# Patient Record
Sex: Male | Born: 1995 | Race: Black or African American | Hispanic: No | Marital: Single | Smoking: Never smoker
Health system: Southern US, Community
[De-identification: ages and names within clinical notes are randomized; demographics above are authoritative.]

## PROBLEM LIST (undated history)

## (undated) DIAGNOSIS — J45909 Unspecified asthma, uncomplicated: Secondary | ICD-10-CM

---
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
## 2016-07-14 ENCOUNTER — Encounter (HOSPITAL_COMMUNITY): Payer: Self-pay | Admitting: Family Medicine

## 2016-07-14 ENCOUNTER — Ambulatory Visit (HOSPITAL_COMMUNITY)
Admission: EM | Admit: 2016-07-14 | Discharge: 2016-07-14 | Disposition: A | Discharge status: Home / Self Care | Payer: Medicaid Other | Attending: Family Medicine | Admitting: Family Medicine

## 2016-07-14 DIAGNOSIS — L0231 Cutaneous abscess of buttock: Secondary | ICD-10-CM | POA: Diagnosis not present

## 2016-07-14 MED ORDER — HYDROCODONE-ACETAMINOPHEN 5-325 MG PO TABS: 1.0000 | ORAL_TABLET | ORAL | 0 refills | Status: DC | PRN

## 2016-07-14 NOTE — ED Triage Notes (Signed)
Pt here for abscess inside of right buttocks.

## 2016-07-14 NOTE — Discharge Instructions (Signed)
Apply warm compresses 3-4 times a day. Be sure to keep the area clean and dried possible. Return in 2 days for wound check and to have the packing removed. The area will be sore and tender. Take ibuprofen 600 mg every 6 hours as needed for pain.

## 2016-07-14 NOTE — ED Provider Notes (Signed)
CSN: [id number]     Arrival date & time 07/14/16  1414 History   First MD Initiated Contact with Patient 07/14/16 1443     Chief Complaint  Patient presents with  . Abscess   (Consider location/radiation/quality/duration/timing/severity/associated sxs/prior Treatment) 21 year old male states he felt a small bump developing on his right buttocks probably 2 weeks ago. Has been getting larger and more painful. He states it is painful to sit down or lie on the right side due to the growing abscess.      Past Medical History:  Diagnosis Date  . Asthma    History reviewed. No pertinent surgical history. History reviewed. No pertinent family history. Social History  Substance Use Topics  . Smoking status: Never Smoker  . Smokeless tobacco: Never Used  . Alcohol use Yes    Review of Systems  Constitutional: Negative.   Musculoskeletal: Negative.   Skin:       Abscess formation to the right medial buttock as per history of present illness.  Neurological: Negative.   All other systems reviewed and are negative.   Allergies  Patient has no known allergies.  Home Medications   Prior to Admission medications   Medication Sig Start Date End Date Taking? Authorizing Provider  famotidine (PEPCID) 20 MG tablet Take 2 tablets (40 mg total) by mouth 2 (two) times daily. 04/21/13   Renne CriglerJoshua Geiple, PA-C  HYDROcodone-acetaminophen (NORCO/VICODIN) 5-325 MG tablet Take 1 tablet by mouth every 4 (four) hours as needed. 07/14/16   Hayden Rasmussenavid Cannie Muckle, NP  ranitidine (ZANTAC) 150 MG tablet Take 150 mg by mouth once.    Historical Provider, MD   Meds Ordered and Administered this Visit  Medications - No data to display  BP 106/68   Pulse 61   Temp 98.3 F (36.8 C)   Resp 18   SpO2 99%  No data found.   Physical Exam  Constitutional: He is oriented to person, place, and time. He appears well-developed and well-nourished. No distress.  Pulmonary/Chest: Effort normal.  Abdominal: Soft.   Musculoskeletal: He exhibits no edema.  Neurological: He is alert and oriented to person, place, and time.  Skin: Skin is warm and dry.  There is an abscess approximately 4 cm x 3 cm along the medial aspect of the right buttock and gluteal cleft. It does not involve the anus. Currently it is not draining. No surrounding cellulitis.  Psychiatric: He has a normal mood and affect.  Nursing note and vitals reviewed.   Urgent Care Course     Procedures (including critical care time)  Labs Review Labs Reviewed - No data to display  Imaging Review No results found.   Visual Acuity Review  Right Eye Distance:   Left Eye Distance:   Bilateral Distance:    Right Eye Near:   Left Eye Near:    Bilateral Near:         MDM   1. Abscess of buttock, right    Apply warm compresses 3-4 times a day. Be sure to keep the area clean and dried possible. Return in 2 days for wound check and to have the packing removed. The area will be sore and tender. Take ibuprofen 600 mg every 6 hours as needed for pain. Meds ordered this encounter  Medications  . HYDROcodone-acetaminophen (NORCO/VICODIN) 5-325 MG tablet    Sig: Take 1 tablet by mouth every 4 (four) hours as needed.    Dispense:  10 tablet    Refill:  0  Order Specific Question:   Supervising Provider    Answer:   Elvina Sidle [[id number]]   Dressing applied and assisted by  Kipp Brood, EMT    Hayden Rasmussen, NP 07/14/16 1537    Hayden Rasmussen, NP 07/14/16 1539

## 2016-07-17 ENCOUNTER — Encounter (HOSPITAL_COMMUNITY): Payer: Self-pay | Admitting: *Deleted

## 2016-07-17 ENCOUNTER — Ambulatory Visit (HOSPITAL_COMMUNITY)
Admission: EM | Admit: 2016-07-17 | Discharge: 2016-07-17 | Disposition: A | Discharge status: Home / Self Care | Payer: Medicaid Other | Attending: Radiology | Admitting: Radiology

## 2016-07-17 DIAGNOSIS — Z5189 Encounter for other specified aftercare: Secondary | ICD-10-CM

## 2016-07-17 MED ORDER — CEPHALEXIN 500 MG PO CAPS: 500.0000 mg | ORAL_CAPSULE | Freq: Three times a day (TID) | ORAL | 0 refills | Status: DC

## 2016-07-17 NOTE — ED Triage Notes (Signed)
Presents for f/u buttock abscess.  Packing remains in place per pt.  States feeling much better.

## 2016-07-17 NOTE — ED Provider Notes (Signed)
CSN: [id number]     Arrival date & time 07/17/16  1245 History   None    Chief Complaint  Patient presents with  . Wound Check   (Consider location/radiation/quality/duration/timing/severity/associated sxs/prior Treatment) 21 y.o. male presents with wound recheck. Patient was seen at this faicilty on 2.7.18 for an abscess to the right medial buttocks/  Condition is acute in nature. Condition is made better by packing and antibiotics Condition is made worse by nothing. Patient denies any fevers and reports that the abscess has improved. Packing was removed in 1 piece within minimal bleeding and no pus noted.        Past Medical History:  Diagnosis Date  . Asthma    History reviewed. No pertinent surgical history. No family history on file. Social History  Substance Use Topics  . Smoking status: Never Smoker  . Smokeless tobacco: Never Used  . Alcohol use Yes    Review of Systems  Constitutional: Negative for chills and fever.  HENT: Negative for ear pain and sore throat.   Eyes: Negative for pain and visual disturbance.  Respiratory: Negative for cough and shortness of breath.   Cardiovascular: Negative for chest pain and palpitations.  Gastrointestinal: Negative for abdominal pain and vomiting.  Genitourinary: Negative for dysuria and hematuria.  Musculoskeletal: Negative for arthralgias and back pain.  Skin: Negative for color change and rash.       Resolving abscess to right buttocks  Neurological: Negative for seizures and syncope.  All other systems reviewed and are negative.   Allergies  Patient has no known allergies.  Home Medications   Prior to Admission medications   Medication Sig Start Date End Date Taking? Authorizing Provider  famotidine (PEPCID) 20 MG tablet Take 2 tablets (40 mg total) by mouth 2 (two) times daily. 04/21/13   Renne CriglerJoshua Geiple, PA-C  HYDROcodone-acetaminophen (NORCO/VICODIN) 5-325 MG tablet Take 1 tablet by mouth every 4 (four) hours as  needed. 07/14/16   Hayden Rasmussenavid Mabe, NP  ranitidine (ZANTAC) 150 MG tablet Take 150 mg by mouth once.    Historical Provider, MD   Meds Ordered and Administered this Visit  Medications - No data to display  BP 118/66   Pulse 68   Temp 98.3 F (36.8 C) (Oral)   Resp 16   SpO2 100%  No data found.   Physical Exam  Constitutional: He appears well-developed and well-nourished.  HENT:  Head: Normocephalic and atraumatic.  Eyes: Conjunctivae are normal.  Neck: Neck supple.  Cardiovascular: Normal rate and regular rhythm.   No murmur heard. Pulmonary/Chest: Effort normal and breath sounds normal. No respiratory distress.  Abdominal: Soft. There is no tenderness.  Musculoskeletal: He exhibits no edema.  Neurological: He is alert.  Skin: Skin is warm and dry.  Resolving abscess to right medial buttocks.   Psychiatric: He has a normal mood and affect.  Nursing note and vitals reviewed.   Urgent Care Course     Procedures (including critical care time)  Labs Review Labs Reviewed - No data to display  Imaging Review No results found.      MDM   1. Visit for wound check        Alene MiresJennifer C Omohundro, NP 07/17/16 1535

## 2016-07-17 NOTE — Discharge Instructions (Signed)
Continue antibiotics

## 2018-05-25 ENCOUNTER — Other Ambulatory Visit: Payer: Self-pay

## 2018-05-25 ENCOUNTER — Encounter (HOSPITAL_COMMUNITY): Payer: Self-pay

## 2018-05-25 ENCOUNTER — Ambulatory Visit (HOSPITAL_COMMUNITY)
Admission: EM | Admit: 2018-05-25 | Discharge: 2018-05-25 | Disposition: A | Discharge status: Home / Self Care | Payer: Self-pay | Attending: Family Medicine | Admitting: Family Medicine

## 2018-05-25 DIAGNOSIS — G43909 Migraine, unspecified, not intractable, without status migrainosus: Secondary | ICD-10-CM | POA: Insufficient documentation

## 2018-05-25 DIAGNOSIS — J069 Acute upper respiratory infection, unspecified: Secondary | ICD-10-CM | POA: Insufficient documentation

## 2018-05-25 MED ORDER — BUTALBITAL-APAP-CAFFEINE 50-325-40 MG PO TABS: 1.0000 | ORAL_TABLET | Freq: Four times a day (QID) | ORAL | 0 refills | Status: AC | PRN

## 2018-05-25 NOTE — ED Provider Notes (Signed)
[facility]    CSN: [id number] Arrival date & time: 05/25/18  1026     History   Chief Complaint Chief Complaint  Patient presents with  . Cough  . Migraine    HPI Rodney Becker is a 22 y.o. male.   HPI  Patient is here with 2 complaints.  First he has a runny and stuffy nose, mild cough, low-grade fever.  This is been going on for about a week.  He has some right ear discomfort.  Mild sore throat.  No body aches, no fatigue.  He has continued with his normal activities.  Today he woke up with a headache.  He has some light sensitivity.  He does have a history of migraines.  Today he feels like he has a migraine on top of his "head cold".  No exposure to influenza.  No exposure to strep.  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  No nausea or vomiting.  Past Medical History:  Diagnosis Date  . Asthma     There are no active problems to display for this patient.   History reviewed. No pertinent surgical history.     Home Medications    Prior to Admission medications   Medication Sig Start Date End Date Taking? Authorizing Provider  butalbital-acetaminophen-caffeine (FIORICET, [facility]) [id number] MG tablet Take 1-2 tablets by mouth every 6 (six) hours as needed for headache. 05/25/18 05/25/19  Eustace MooreNelson, Qais Jowers Sue, MD    Family History History reviewed. No pertinent family history.  Social History Social History   Tobacco Use  . Smoking status: Never Smoker  . Smokeless tobacco: Never Used  Substance Use Topics  . Alcohol use: Yes  . Drug use: No     Allergies   Patient has no known allergies.   Review of Systems Review of Systems  Constitutional: Negative for chills and fever.  HENT: Positive for congestion, postnasal drip, rhinorrhea and sore throat. Negative for ear pain.   Eyes: Positive for photophobia. Negative for pain and visual disturbance.  Respiratory: Positive for cough. Negative for shortness of breath.     Cardiovascular: Negative for chest pain and palpitations.  Gastrointestinal: Negative for abdominal pain and vomiting.  Genitourinary: Negative for dysuria and hematuria.  Musculoskeletal: Negative for arthralgias and back pain.  Skin: Negative for color change and rash.  Neurological: Positive for headaches. Negative for seizures and syncope.  All other systems reviewed and are negative.    Physical Exam Triage Vital Signs ED Triage Vitals  Enc Vitals Group     BP 05/25/18 1145 115/73     Pulse Rate 05/25/18 1145 81     Resp 05/25/18 1145 16     Temp 05/25/18 1145 99.2 F (37.3 C)     Temp Source 05/25/18 1145 Oral     SpO2 05/25/18 1145 100 %     Weight 05/25/18 1144 123 lb (55.8 kg)     Height --      Head Circumference --      Peak Flow --      Pain Score 05/25/18 1143 10     Pain Loc --      Pain Edu? --      Excl. in GC? --    No data found.  Updated Vital Signs BP 115/73 (BP Location: Right Arm)   Pulse 81   Temp 99.2 F (37.3 C) (Oral)   Resp 16   Wt 55.8 kg   SpO2 100%  BMI 21.79 kg/m      Physical Exam Constitutional:      General: He is not in acute distress.    Appearance: Normal appearance. He is well-developed. He is not ill-appearing.  HENT:     Head: Normocephalic and atraumatic.     Right Ear: Tympanic membrane, ear canal and external ear normal.     Left Ear: Tympanic membrane, ear canal and external ear normal.     Nose: Congestion and rhinorrhea present.     Mouth/Throat:     Mouth: Mucous membranes are moist.     Pharynx: No oropharyngeal exudate or posterior oropharyngeal erythema.  Eyes:     Extraocular Movements: Extraocular movements intact.     Conjunctiva/sclera: Conjunctivae normal.     Pupils: Pupils are equal, round, and reactive to light.  Neck:     Musculoskeletal: Normal range of motion. No neck rigidity.  Cardiovascular:     Rate and Rhythm: Normal rate and regular rhythm.     Heart sounds: Normal heart sounds.   Pulmonary:     Effort: Pulmonary effort is normal. No respiratory distress.     Breath sounds: Normal breath sounds. No rhonchi.  Abdominal:     General: Abdomen is flat. There is no distension.     Palpations: Abdomen is soft.     Tenderness: There is no abdominal tenderness.  Musculoskeletal: Normal range of motion.  Lymphadenopathy:     Cervical: No cervical adenopathy.  Skin:    General: Skin is warm and dry.  Neurological:     General: No focal deficit present.     Mental Status: He is alert. Mental status is at baseline.     Cranial Nerves: No cranial nerve deficit.     Coordination: Coordination normal.     Gait: Gait normal.     Deep Tendon Reflexes: Reflexes normal.  Psychiatric:        Mood and Affect: Mood normal.        Thought Content: Thought content normal.      UC Treatments / Results  Labs (all labs ordered are listed, but only abnormal results are displayed) Labs Reviewed - No data to display  EKG None  Radiology No results found.  Procedures Procedures (including critical care time)  Medications Ordered in UC Medications - No data to display  Initial Impression / Assessment and Plan / UC Course  I have reviewed the triage vital signs and the nursing notes.  Pertinent labs & imaging results that were available during my care of the patient were reviewed by me and considered in my medical decision making (see chart for details).    Patient is advised regarding symptomatic care for his viral upper respiratory infection.  Since over-the-counter medicines do not help his headaches, we will give him a limited number of Fioricet Follow-up with PCP Final Clinical Impressions(s) / UC Diagnoses   Final diagnoses:  Viral upper respiratory tract infection  Migraine without status migrainosus, not intractable, unspecified migraine type     Discharge Instructions     May use OTC cough and cold medicine Push fluids For migraine headache - take  headache medicine and try to rest    ED Prescriptions    Medication Sig Dispense Auth. Provider   butalbital-acetaminophen-caffeine (FIORICET, ESGIC) 50-325-40 MG tablet Take 1-2 tablets by mouth every 6 (six) hours as needed for headache. 20 tablet Eustace Hosmer, MD     Controlled Substance Prescriptions [place] Controlled Substance Registry consulted? Yes, I  have consulted the [place] Controlled Substances Registry for this patient, and feel the risk/benefit ratio today is favorable for proceeding with this prescription for a controlled substance.   Eustace MooreNelson, Athelene Hursey Sue, MD 05/25/18 1350

## 2018-05-25 NOTE — Discharge Instructions (Addendum)
May use OTC cough and cold medicine Push fluids For migraine headache - take headache medicine and try to rest

## 2018-05-25 NOTE — ED Triage Notes (Signed)
Pt cc cough and loss of appetite and chill , right ear discomfort off and on for a week.

## 2020-08-20 ENCOUNTER — Encounter: Payer: Self-pay | Admitting: Emergency Medicine

## 2020-08-20 ENCOUNTER — Other Ambulatory Visit: Payer: Self-pay

## 2020-08-20 DIAGNOSIS — S3992XA Unspecified injury of lower back, initial encounter: Secondary | ICD-10-CM | POA: Diagnosis present

## 2020-08-20 DIAGNOSIS — S39012A Strain of muscle, fascia and tendon of lower back, initial encounter: Secondary | ICD-10-CM | POA: Insufficient documentation

## 2020-08-20 DIAGNOSIS — J45909 Unspecified asthma, uncomplicated: Secondary | ICD-10-CM | POA: Insufficient documentation

## 2020-08-20 DIAGNOSIS — Y92410 Unspecified street and highway as the place of occurrence of the external cause: Secondary | ICD-10-CM | POA: Insufficient documentation
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
## 2020-08-21 ENCOUNTER — Emergency Department
Admission: EM | Admit: 2020-08-21 | Discharge: 2020-08-21 | Disposition: A | Discharge status: Home / Self Care | Payer: No Typology Code available for payment source | Attending: Emergency Medicine | Admitting: Emergency Medicine

## 2020-08-21 DIAGNOSIS — S39012A Strain of muscle, fascia and tendon of lower back, initial encounter: Secondary | ICD-10-CM

## 2020-08-21 MED ORDER — CYCLOBENZAPRINE HCL 5 MG PO TABS: ORAL_TABLET | ORAL | 0 refills | Status: AC

## 2020-08-21 MED ORDER — KETOROLAC TROMETHAMINE 30 MG/ML IJ SOLN
30.0000 mg | Freq: Once | INTRAMUSCULAR | Status: AC
  Administered 2020-08-21: 30 mg via INTRAMUSCULAR
  Filled 2020-08-21: qty 1

## 2020-08-21 MED ORDER — IBUPROFEN 600 MG PO TABS: 600.0000 mg | ORAL_TABLET | Freq: Three times a day (TID) | ORAL | 0 refills | Status: AC | PRN

## 2020-08-21 MED ORDER — CYCLOBENZAPRINE HCL 10 MG PO TABS
5.0000 mg | ORAL_TABLET | Freq: Once | ORAL | Status: AC
  Administered 2020-08-21: 5 mg via ORAL
  Filled 2020-08-21: qty 1

## 2020-08-21 NOTE — Discharge Instructions (Signed)
1.  You may take medicines as needed for pain and muscle spasms (Motrin/Flexeril #15). °2.  Apply moist heat to affected area several times daily. °3.  Return to the ER for worsening symptoms, persistent vomiting, difficulty breathing or other concerns. °
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
## 2024-03-17 ENCOUNTER — Encounter (HOSPITAL_COMMUNITY): Payer: Self-pay

## 2024-03-17 ENCOUNTER — Emergency Department (HOSPITAL_COMMUNITY): Payer: Self-pay

## 2024-03-17 ENCOUNTER — Other Ambulatory Visit: Payer: Self-pay

## 2024-03-17 ENCOUNTER — Emergency Department (HOSPITAL_COMMUNITY)
Admission: EM | Admit: 2024-03-17 | Discharge: 2024-03-17 | Disposition: A | Discharge status: Home / Self Care | Payer: Self-pay | Attending: Emergency Medicine | Admitting: Emergency Medicine

## 2024-03-17 DIAGNOSIS — Y92410 Unspecified street and highway as the place of occurrence of the external cause: Secondary | ICD-10-CM | POA: Insufficient documentation

## 2024-03-17 DIAGNOSIS — S46912A Strain of unspecified muscle, fascia and tendon at shoulder and upper arm level, left arm, initial encounter: Secondary | ICD-10-CM | POA: Diagnosis not present

## 2024-03-17 DIAGNOSIS — M25511 Pain in right shoulder: Secondary | ICD-10-CM | POA: Diagnosis present
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
## 2024-03-17 NOTE — ED Notes (Signed)
 Patient transported to X-ray

## 2024-03-17 NOTE — Discharge Instructions (Signed)
 As we discussed, you can use ibuprofen , naproxen, and/or Tylenol  as needed for pain.  Otherwise, conservative you have gentle range of motion of the left shoulder to ensure that it does not stiffen, follow-up with your primary care provider for further concerns.
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
